# Patient Record
Sex: Male | Born: 1955 | Race: White | Hispanic: No | Marital: Married | Smoking: Never smoker
Health system: Southern US, Community
[De-identification: ages and names within clinical notes are randomized; demographics above are authoritative.]

## PROBLEM LIST (undated history)

## (undated) HISTORY — PX: HERNIA REPAIR: SHX51

---

## 2008-02-17 ENCOUNTER — Emergency Department (HOSPITAL_BASED_OUTPATIENT_CLINIC_OR_DEPARTMENT_OTHER): Admission: EM | Admit: 2008-02-17 | Discharge: 2008-02-17 | Payer: Self-pay | Admitting: Emergency Medicine

## 2008-08-11 ENCOUNTER — Emergency Department (HOSPITAL_BASED_OUTPATIENT_CLINIC_OR_DEPARTMENT_OTHER): Admission: EM | Admit: 2008-08-11 | Discharge: 2008-08-11 | Payer: Self-pay | Admitting: Emergency Medicine

## 2008-08-11 ENCOUNTER — Ambulatory Visit: Payer: Self-pay | Admitting: Diagnostic Radiology

## 2009-07-29 ENCOUNTER — Emergency Department (HOSPITAL_BASED_OUTPATIENT_CLINIC_OR_DEPARTMENT_OTHER): Admission: EM | Admit: 2009-07-29 | Discharge: 2009-07-29 | Payer: Self-pay | Admitting: Emergency Medicine

## 2009-07-29 ENCOUNTER — Ambulatory Visit: Payer: Self-pay | Admitting: Diagnostic Radiology

## 2019-06-13 ENCOUNTER — Emergency Department (HOSPITAL_BASED_OUTPATIENT_CLINIC_OR_DEPARTMENT_OTHER): Payer: Managed Care, Other (non HMO)

## 2019-06-13 ENCOUNTER — Other Ambulatory Visit: Payer: Self-pay

## 2019-06-13 ENCOUNTER — Emergency Department (HOSPITAL_BASED_OUTPATIENT_CLINIC_OR_DEPARTMENT_OTHER)
Admission: EM | Admit: 2019-06-13 | Discharge: 2019-06-13 | Disposition: A | Discharge status: Home / Self Care | Payer: Managed Care, Other (non HMO) | Attending: Emergency Medicine | Admitting: Emergency Medicine

## 2019-06-13 ENCOUNTER — Encounter (HOSPITAL_BASED_OUTPATIENT_CLINIC_OR_DEPARTMENT_OTHER): Payer: Self-pay | Admitting: Emergency Medicine

## 2019-06-13 DIAGNOSIS — N134 Hydroureter: Secondary | ICD-10-CM | POA: Diagnosis not present

## 2019-06-13 DIAGNOSIS — Z880 Allergy status to penicillin: Secondary | ICD-10-CM | POA: Insufficient documentation

## 2019-06-13 DIAGNOSIS — R319 Hematuria, unspecified: Secondary | ICD-10-CM | POA: Diagnosis present

## 2019-06-13 DIAGNOSIS — N132 Hydronephrosis with renal and ureteral calculous obstruction: Secondary | ICD-10-CM | POA: Diagnosis not present

## 2019-06-13 DIAGNOSIS — N200 Calculus of kidney: Secondary | ICD-10-CM

## 2019-06-13 LAB — BASIC METABOLIC PANEL
Anion gap: 8 (ref 5–15)
BUN: 20 mg/dL (ref 8–23)
CO2: 25 mmol/L (ref 22–32)
Calcium: 9.2 mg/dL (ref 8.9–10.3)
Chloride: 106 mmol/L (ref 98–111)
Creatinine, Ser: 1.09 mg/dL (ref 0.61–1.24)
GFR calc Af Amer: >60 mL/min (ref >60)
GFR calc non Af Amer: >60 mL/min (ref >60)
Glucose, Bld: 109 mg/dL — ABNORMAL HIGH (ref 70–99)
Potassium: 4.3 mmol/L (ref 3.5–5.1)
Sodium: 139 mmol/L (ref 135–145)

## 2019-06-13 LAB — URINALYSIS, MICROSCOPIC (REFLEX): RBC / HPF: >50 RBC/hpf (ref 0–5)

## 2019-06-13 LAB — CBC WITH DIFFERENTIAL/PLATELET
Abs Immature Granulocytes: 0.05 10*3/uL (ref 0.00–0.07)
Basophils Absolute: 0 10*3/uL (ref 0.0–0.1)
Basophils Relative: 0 %
Eosinophils Absolute: 0.1 10*3/uL (ref 0.0–0.5)
Eosinophils Relative: 1 %
HCT: 46.4 % (ref 39.0–52.0)
Hemoglobin: 14.9 g/dL (ref 13.0–17.0)
Immature Granulocytes: 0 %
Lymphocytes Relative: 20 %
Lymphs Abs: 2.3 10*3/uL (ref 0.7–4.0)
MCH: 29.6 pg (ref 26.0–34.0)
MCHC: 32.1 g/dL (ref 30.0–36.0)
MCV: 92.2 fL (ref 80.0–100.0)
Monocytes Absolute: 0.7 10*3/uL (ref 0.1–1.0)
Monocytes Relative: 6 %
Neutro Abs: 8 10*3/uL — ABNORMAL HIGH (ref 1.7–7.7)
Neutrophils Relative %: 73 %
Platelets: 179 10*3/uL (ref 150–400)
RBC: 5.03 MIL/uL (ref 4.22–5.81)
RDW: 13.5 % (ref 11.5–15.5)
WBC: 11.1 10*3/uL — ABNORMAL HIGH (ref 4.0–10.5)
nRBC: 0 % (ref 0.0–0.2)

## 2019-06-13 LAB — URINALYSIS, ROUTINE W REFLEX MICROSCOPIC
Bilirubin Urine: NEGATIVE
Glucose, UA: NEGATIVE mg/dL
Ketones, ur: NEGATIVE mg/dL
Leukocytes,Ua: NEGATIVE
Nitrite: NEGATIVE
Protein, ur: NEGATIVE mg/dL
Specific Gravity, Urine: 1.02 (ref 1.005–1.030)
pH: 6.5 (ref 5.0–8.0)

## 2019-06-13 MED ORDER — SULFAMETHOXAZOLE-TRIMETHOPRIM 800-160 MG PO TABS: 1.0000 | ORAL_TABLET | Freq: Two times a day (BID) | ORAL | 0 refills | Status: AC

## 2019-06-13 MED ORDER — TAMSULOSIN HCL 0.4 MG PO CAPS: 0.4000 mg | ORAL_CAPSULE | Freq: Every day | ORAL | 0 refills | Status: DC

## 2019-06-13 MED ORDER — KETOROLAC TROMETHAMINE 30 MG/ML IJ SOLN
30.0000 mg | Freq: Once | INTRAMUSCULAR | Status: AC
  Administered 2019-06-13: 11:00:00 30 mg via INTRAVENOUS
  Filled 2019-06-13: qty 1

## 2019-06-13 MED ORDER — HYDROCODONE-ACETAMINOPHEN 5-325 MG PO TABS: 1.0000 | ORAL_TABLET | ORAL | 0 refills | Status: DC | PRN

## 2019-06-13 MED ORDER — MORPHINE SULFATE (PF) 4 MG/ML IV SOLN
4.0000 mg | Freq: Once | INTRAVENOUS | Status: AC
  Administered 2019-06-13: 11:00:00 4 mg via INTRAVENOUS
  Filled 2019-06-13: qty 1

## 2019-06-13 NOTE — ED Notes (Signed)
Bladder scan 56ml

## 2019-06-13 NOTE — ED Provider Notes (Signed)
[facility] EMERGENCY DEPARTMENT Provider Note   CSN: [id number] Arrival date & time: 06/13/19  7564   History   Chief Complaint Chief Complaint  Patient presents with  . Hematuria   HPI Jim Galloway is a 63 y.o. male with past medical history significant for hernia repair who presents for evaluation of hematuria.  Patient states he has had dark-colored urine over the last 2 days.  Patient states he has difficulty beginning his stream and then feels like his stream is intermittent.  He feels like he is unable to fully empty his bladder.  He denies prior history of prostate issues.    Denies any testicular pain, swelling, penile discharge.  He does have history of renal and bladder stones.  He denies prior need of Foley catheter.  Denies fever, chills, nausea, vomiting, chest pain, shortness of breath, abdominal pain, flank pain, diarrhea, constipation.  He is able to tolerate p.o. intake at home without difficulty.  Denies additional aggravating or alleviating factors. Denies anticoagulation.  History obtained from patient, wife in room and past medical records.  No nterpreter is used.     HPI  History reviewed. No pertinent past medical history.  There are no active problems to display for this patient.   Past Surgical History:  Procedure Laterality Date  . HERNIA REPAIR          Home Medications    Prior to Admission medications   Medication Sig Start Date End Date Taking? Authorizing Provider  HYDROcodone-acetaminophen (NORCO/VICODIN) 5-325 MG tablet Take 1-2 tablets by mouth every 4 (four) hours as needed. 06/13/19   Stuart Guillen A, PA-C  sulfamethoxazole-trimethoprim (BACTRIM DS) 800-160 MG tablet Take 1 tablet by mouth 2 (two) times daily for 7 days. 06/13/19 06/20/19  Emile Ringgenberg A, PA-C  tamsulosin (FLOMAX) 0.4 MG CAPS capsule Take 1 capsule (0.4 mg total) by mouth daily. 06/13/19   Kadedra Vanaken A, PA-C    Family History No family history  on file.  Social History Social History   Tobacco Use  . Smoking status: Never Smoker  . Smokeless tobacco: Never Used  Substance Use Topics  . Alcohol use: Not Currently  . Drug use: Not on file     Allergies   Penicillins   Review of Systems Review of Systems  Constitutional: Negative.   HENT: Negative.   Respiratory: Negative.   Cardiovascular: Negative.   Gastrointestinal: Negative.   Genitourinary: Positive for difficulty urinating, dysuria and hematuria. Negative for decreased urine volume, discharge, flank pain, frequency, genital sores, penile pain, penile swelling, scrotal swelling, testicular pain and urgency.  Musculoskeletal: Negative.   Skin: Negative.   Neurological: Negative.   All other systems reviewed and are negative.    Physical Exam Updated Vital Signs BP (!) 151/76 (BP Location: Right Arm)   Pulse 89   Temp 98.5 F (36.9 C) (Oral)   Resp 20   Ht 5\' 6"  (1.676 m)   Wt 85.3 kg   SpO2 98%   BMI 30.34 kg/m   Physical Exam Vitals signs and nursing note reviewed. Exam conducted with a chaperone present.  Constitutional:      General: He is not in acute distress.    Appearance: He is well-developed. He is not ill-appearing, toxic-appearing or diaphoretic.  HENT:     Head: Normocephalic and atraumatic.     Nose: Nose normal.     Mouth/Throat:     Mouth: Mucous membranes are moist.     Pharynx: Oropharynx is clear.  Eyes:     Pupils: Pupils are equal, round, and reactive to light.  Neck:     Musculoskeletal: Normal range of motion and neck supple.  Cardiovascular:     Rate and Rhythm: Normal rate and regular rhythm.     Pulses: Normal pulses.  Pulmonary:     Effort: Pulmonary effort is normal. No respiratory distress.     Breath sounds: Normal breath sounds.  Abdominal:     General: Bowel sounds are normal. There is no distension.     Palpations: Abdomen is soft.     Tenderness: There is no abdominal tenderness. There is no right CVA  tenderness, left CVA tenderness, guarding or rebound.     Hernia: A hernia is present.     Comments: Soft, nontender without rebound or guarding.  Negative CVA tap  Genitourinary:    Penis: Normal.      Scrotum/Testes: Normal. Cremasteric reflex is present.     Epididymis:     Right: Normal.     Left: Normal.  Musculoskeletal: Normal range of motion.     Comments: Moves all 4 extremities without difficulty.  Skin:    General: Skin is warm and dry.     Comments: Brisk capillary refill.  No rashes or lesions.  Neurological:     Mental Status: He is alert.     Comments: Cranial nerves II through XII grossly intact.  Ambulatory without difficulty.    ED Treatments / Results  Labs (all labs ordered are listed, but only abnormal results are displayed) Labs Reviewed  URINALYSIS, ROUTINE W REFLEX MICROSCOPIC - Abnormal; Notable for the following components:      Result Value   APPearance HAZY (*)    Hgb urine dipstick LARGE (*)    All other components within normal limits  CBC WITH DIFFERENTIAL/PLATELET - Abnormal; Notable for the following components:   WBC 11.1 (*)    Neutro Abs 8.0 (*)    All other components within normal limits  BASIC METABOLIC PANEL - Abnormal; Notable for the following components:   Glucose, Bld 109 (*)    All other components within normal limits  URINALYSIS, MICROSCOPIC (REFLEX) - Abnormal; Notable for the following components:   Bacteria, UA MANY (*)    All other components within normal limits  URINE CULTURE    EKG None  Radiology Ct Renal Stone Study  Result Date: 06/13/2019 CLINICAL DATA:  Hematuria and dysuria. EXAM: CT ABDOMEN AND PELVIS WITHOUT CONTRAST TECHNIQUE: Multidetector CT imaging of the abdomen and pelvis was performed following the standard protocol without IV contrast. COMPARISON:  None. FINDINGS: Lower chest: No acute abnormality. Calcified granuloma identified in the right lung base. Hepatobiliary: No focal liver abnormality is  seen. No gallstones, gallbladder wall thickening, or biliary dilatation. Pancreas: Unremarkable. No pancreatic ductal dilatation or surrounding inflammatory changes. Spleen: Normal in size without focal abnormality. Adrenals/Urinary Tract: Normal appearance of the adrenal glands. Bilateral kidney stones identified. Right-sided hydronephrosis, hydroureter, periureteral fat stranding noted. Within the distal right ureter there is a stone just before the UVJ which measures 2.3 x 4.3 mm, image 61/4. Urinary bladder unremarkable. Stomach/Bowel: Stomach is within normal limits. Appendix appears normal. No evidence of bowel wall thickening, distention, or inflammatory changes. Scattered colonic diverticula identified. No acute inflammation Vascular/Lymphatic: Aortic atherosclerosis. No aneurysm. No abdominopelvic adenopathy. Reproductive: Prostate is unremarkable. Other: No free fluid or fluid collections. Musculoskeletal: No acute or significant osseous findings. Lumbar spondylosis noted. IMPRESSION: 1. Right-sided hydronephrosis and hydroureter secondary to distal right  ureteral calculus measuring 2.3 x 4.3 mm. 2. Bilateral nephrolithiasis. Aortic Atherosclerosis (ICD10-I70.0). Electronically Signed   By: Signa Kellaylor  Stroud M.D.   On: 06/13/2019 09:58    Procedures Procedures (including critical care time)  Medications Ordered in ED Medications  morphine 4 MG/ML injection 4 mg (4 mg Intravenous Given 06/13/19 1116)  ketorolac (TORADOL) 30 MG/ML injection 30 mg (30 mg Intravenous Given 06/13/19 1114)     Initial Impression / Assessment and Plan / ED Course  I have reviewed the triage vital signs and the nursing notes.  Pertinent labs & imaging results that were available during my care of the patient were reviewed by me and considered in my medical decision making (see chart for details).  63 year old male appears otherwise well presents for evaluation of hematuria.  He is afebrile, nonseptic,  non-ill-appearing.  Feels like he cannot empty his bladder.  Has difficulty starting his stream.  No prior history of prostate issues.  Abdomen soft.  Nonsurgical abdomen.  GU exam without any significant findings. Hx of renal stones and bladder stones.  No anticoagulation.  Plan for urinalysis, culture, CT stone, bladder scan.  CT scan with 4 mm right stone with mild hydronephrosis and perinephric stranding.  Urinalysis with some bacteria however negative leuks, nitrites, 0-5 WBC.  Will culture however low suspicion for infection.  On reevaluation patient does admit to some mild right-sided pain as well as some frequency in his urination.  Will provide pain management.  Labs without any acute findings, no abnormal creatinine or BUN. Tolerating PO intake without difficulty.  Pain controlled in ED.  Plan to DC home with pain management and close urology follow-up.  Discussed strict return precautions.  Patient voiced understanding and is agreeable for follow-up.  Patient is nontoxic, nonseptic appearing, in no apparent distress.  Patient's pain and other symptoms adequately managed in emergency department.  Fluid bolus given.  Labs, imaging and vitals reviewed.  Patient does not meet the SIRS or Sepsis criteria.  On repeat exam patient does not have a surgical abdomin and there are no peritoneal signs.  No indication of appendicitis, bowel obstruction, bowel perforation, cholecystitis, diverticulitis.  Patient discharged home with symptomatic treatment and given strict instructions for follow-up with their primary care physician.  I have also discussed reasons to return immediately to the ER.  Patient expresses understanding and agrees with plan.       Final Clinical Impressions(s) / ED Diagnoses   Final diagnoses:  Nephrolithiasis    ED Discharge Orders         Ordered    HYDROcodone-acetaminophen (NORCO/VICODIN) 5-325 MG tablet  Every 4 hours PRN     06/13/19 1148    tamsulosin (FLOMAX) 0.4  MG CAPS capsule  Daily     06/13/19 1148    sulfamethoxazole-trimethoprim (BACTRIM DS) 800-160 MG tablet  2 times daily     06/13/19 1148           Marquesha Robideau A, PA-C 06/13/19 1151    Arby BarrettePfeiffer, Marcy, MD 06/14/19 [phone]

## 2019-06-13 NOTE — Discharge Instructions (Signed)
Take the medications as prescribed.  Not drive or operate heavy machinery while taking this medication.  This medication may make you lightheaded and dizzy when you go from sitting to standing.  I would recommend dangling with your feet on the edge of the bed before you begin to move.  Make sure you drink plenty of fluids.  Follow-up with urology in the next 24 to 48 hours.  Return if you develop new or worsening symptoms such as high fever, inability to urinate, persistent vomiting, pain uncontrolled with medications provided to you at discharge.

## 2019-06-13 NOTE — ED Triage Notes (Signed)
Hematuria, dysuria, and difficulty urinating since yesterday.

## 2019-06-14 LAB — URINE CULTURE: Culture: NO GROWTH

## 2020-08-11 ENCOUNTER — Emergency Department (HOSPITAL_BASED_OUTPATIENT_CLINIC_OR_DEPARTMENT_OTHER): Payer: Managed Care, Other (non HMO)

## 2020-08-11 ENCOUNTER — Encounter (HOSPITAL_BASED_OUTPATIENT_CLINIC_OR_DEPARTMENT_OTHER): Payer: Self-pay | Admitting: Emergency Medicine

## 2020-08-11 ENCOUNTER — Emergency Department (HOSPITAL_BASED_OUTPATIENT_CLINIC_OR_DEPARTMENT_OTHER)
Admission: EM | Admit: 2020-08-11 | Discharge: 2020-08-11 | Disposition: A | Discharge status: Home / Self Care | Payer: Managed Care, Other (non HMO) | Attending: Emergency Medicine | Admitting: Emergency Medicine

## 2020-08-11 ENCOUNTER — Other Ambulatory Visit: Payer: Self-pay

## 2020-08-11 DIAGNOSIS — M25512 Pain in left shoulder: Secondary | ICD-10-CM | POA: Diagnosis not present

## 2020-08-11 DIAGNOSIS — Z20822 Contact with and (suspected) exposure to covid-19: Secondary | ICD-10-CM | POA: Diagnosis not present

## 2020-08-11 DIAGNOSIS — M549 Dorsalgia, unspecified: Secondary | ICD-10-CM | POA: Insufficient documentation

## 2020-08-11 DIAGNOSIS — R0789 Other chest pain: Secondary | ICD-10-CM | POA: Diagnosis present

## 2020-08-11 LAB — D-DIMER, QUANTITATIVE: D-Dimer, Quant: 1.39 ug/mL-FEU — ABNORMAL HIGH (ref 0.00–0.50)

## 2020-08-11 LAB — CBC
HCT: 47.8 % (ref 39.0–52.0)
Hemoglobin: 16.1 g/dL (ref 13.0–17.0)
MCH: 30 pg (ref 26.0–34.0)
MCHC: 33.7 g/dL (ref 30.0–36.0)
MCV: 89 fL (ref 80.0–100.0)
Platelets: 215 10*3/uL (ref 150–400)
RBC: 5.37 MIL/uL (ref 4.22–5.81)
RDW: 13.3 % (ref 11.5–15.5)
WBC: 12.4 10*3/uL — ABNORMAL HIGH (ref 4.0–10.5)
nRBC: 0 % (ref 0.0–0.2)

## 2020-08-11 LAB — COMPREHENSIVE METABOLIC PANEL
ALT: 21 U/L (ref 0–44)
AST: 19 U/L (ref 15–41)
Albumin: 4.3 g/dL (ref 3.5–5.0)
Alkaline Phosphatase: 85 U/L (ref 38–126)
Anion gap: 9 (ref 5–15)
BUN: 22 mg/dL (ref 8–23)
CO2: 27 mmol/L (ref 22–32)
Calcium: 8.9 mg/dL (ref 8.9–10.3)
Chloride: 100 mmol/L (ref 98–111)
Creatinine, Ser: 1.11 mg/dL (ref 0.61–1.24)
GFR, Estimated: >60 mL/min (ref >60)
Glucose, Bld: 101 mg/dL — ABNORMAL HIGH (ref 70–99)
Potassium: 4.5 mmol/L (ref 3.5–5.1)
Sodium: 136 mmol/L (ref 135–145)
Total Bilirubin: 0.6 mg/dL (ref 0.3–1.2)
Total Protein: 7.7 g/dL (ref 6.5–8.1)

## 2020-08-11 LAB — RESP PANEL BY RT-PCR (FLU A&B, COVID) ARPGX2
Influenza A by PCR: NEGATIVE
Influenza B by PCR: NEGATIVE
SARS Coronavirus 2 by RT PCR: NEGATIVE

## 2020-08-11 LAB — TROPONIN I (HIGH SENSITIVITY)
Troponin I (High Sensitivity): 6 ng/L (ref <18)
Troponin I (High Sensitivity): 6 ng/L (ref <18)

## 2020-08-11 LAB — LIPASE, BLOOD: Lipase: 36 U/L (ref 11–51)

## 2020-08-11 MED ORDER — IOHEXOL 350 MG/ML SOLN
80.0000 mL | Freq: Once | INTRAVENOUS | Status: AC | PRN
  Administered 2020-08-11: 13:00:00 80 mL via INTRAVENOUS

## 2020-08-11 MED ORDER — MORPHINE SULFATE (PF) 4 MG/ML IV SOLN
4.0000 mg | Freq: Once | INTRAVENOUS | Status: AC
  Administered 2020-08-11: 12:00:00 4 mg via INTRAVENOUS
  Filled 2020-08-11: qty 1

## 2020-08-11 MED ORDER — ONDANSETRON HCL 4 MG/2ML IJ SOLN
4.0000 mg | Freq: Once | INTRAMUSCULAR | Status: AC
  Administered 2020-08-11: 12:00:00 4 mg via INTRAVENOUS
  Filled 2020-08-11: qty 2

## 2020-08-11 MED ORDER — ALUM & MAG HYDROXIDE-SIMETH 200-200-20 MG/5ML PO SUSP
30.0000 mL | Freq: Once | ORAL | Status: AC
  Administered 2020-08-11: 15:00:00 30 mL via ORAL
  Filled 2020-08-11: qty 30

## 2020-08-11 MED ORDER — PANTOPRAZOLE SODIUM 20 MG PO TBEC: 20.0000 mg | DELAYED_RELEASE_TABLET | Freq: Every day | ORAL | 0 refills | Status: AC

## 2020-08-11 MED ORDER — HYDROCODONE-ACETAMINOPHEN 5-325 MG PO TABS: 1.0000 | ORAL_TABLET | ORAL | 0 refills | Status: AC | PRN

## 2020-08-11 MED ORDER — DIAZEPAM 5 MG PO TABS: 5.0000 mg | ORAL_TABLET | Freq: Two times a day (BID) | ORAL | 0 refills | Status: AC

## 2020-08-11 NOTE — ED Triage Notes (Signed)
Pt having chest pain since Monday.  Pt seen at urgent care and ED.  Pt told he has pleurisy.  Pt does not believe them.  Pt states his chest hurts on the left radiating to left arm, shoulder and shoulder blade.  No fever, no N/V/D.  Pt denies SOB.  Pt states the shoulder bothers him the most.  Pt states it feels better when he raises his arms.

## 2020-08-11 NOTE — ED Provider Notes (Signed)
[facility] EMERGENCY DEPARTMENT Provider Note   CSN: [id number] Arrival date & time: 08/11/20  1012     History Chief Complaint  Patient presents with  . Chest Pain    Jim Galloway is a 64 y.o. male.  Pt presents to the ED today with cp.  He said it originally started on Monday, 12/20.  He was driving when it started.  He pulled over and then called EMS.  He went to the ED in [place], but was put int he [facility].  Pt had labs done, but was not seen by a provider because the wait was long and he left.  The pt went to UC on 12/21.  He was told he had pleurisy and put on a muscle relaxant.  That has not been helping him, so he came in today.  Pt said the pain is more localized in his left shoulder and back.  Pain is eased by putting his arms over his head.  No f/c.  No sob.     Past Medical History:  Diagnosis Date  . Gout  . High cholesterol  . Hyperlipidemia  . Hypoglycemia  . Kidney stone  . Pharyngitis    Past Surgical History:  Procedure Laterality Date  . HERNIA REPAIR         No family history on file.  Social History   Tobacco Use  . Smoking status: Never Smoker  . Smokeless tobacco: Never Used  Substance Use Topics  . Alcohol use: Not Currently    Home Medications Prior to Admission medications   Medication Sig Start Date End Date Taking? Authorizing Provider  diazepam (VALIUM) 5 MG tablet Take 1 tablet (5 mg total) by mouth 2 (two) times daily. 08/11/20   Jacalyn Lefevre, MD  HYDROcodone-acetaminophen (NORCO/VICODIN) 5-325 MG tablet Take 1 tablet by mouth every 4 (four) hours as needed. 08/11/20   Jacalyn Lefevre, MD  Multiple Vitamin (MULTIVITAMIN ADULT PO) Take by mouth.    [provider]  pantoprazole (PROTONIX) 20 MG tablet Take 1 tablet (20 mg total) by mouth daily. 08/11/20   Jacalyn Lefevre, MD    Allergies    Penicillins  Review of Systems   Review of Systems  Cardiovascular: Positive for chest pain.   Musculoskeletal:       Left shoulder pain  All other systems reviewed and are negative.   Physical Exam Updated Vital Signs BP 138/79 (BP Location: Left Arm)   Pulse 73   Temp 98.1 F (36.7 C)   Resp 20   Ht 5\' 6"  (1.676 m)   Wt 86.2 kg   SpO2 99%   BMI 30.67 kg/m   Physical Exam Vitals and nursing note reviewed.  Constitutional:      Appearance: He is well-developed.  HENT:     Head: Normocephalic and atraumatic.  Eyes:     Extraocular Movements: Extraocular movements intact.     Pupils: Pupils are equal, round, and reactive to light.  Cardiovascular:     Rate and Rhythm: Normal rate and regular rhythm.     Heart sounds: Normal heart sounds.  Pulmonary:     Effort: Pulmonary effort is normal.     Breath sounds: Normal breath sounds.  Abdominal:     General: Bowel sounds are normal.     Palpations: Abdomen is soft.  Musculoskeletal:        General: Normal range of motion.     Cervical back: Normal range of motion and neck supple.  Back:  Skin:    General: Skin is warm.     Capillary Refill: Capillary refill takes less than 2 seconds.  Neurological:     General: No focal deficit present.     Mental Status: He is alert and oriented to person, place, and time.     ED Results / Procedures / Treatments   Labs (all labs ordered are listed, but only abnormal results are displayed) Labs Reviewed  CBC - Abnormal; Notable for the following components:      Result Value   WBC 12.4 (*)    All other components within normal limits  COMPREHENSIVE METABOLIC PANEL - Abnormal; Notable for the following components:   Glucose, Bld 101 (*)    All other components within normal limits  D-DIMER, QUANTITATIVE (NOT AT [facility] [place] [facility]) - Abnormal; Notable for the following components:   D-Dimer, Quant 1.39 (*)    All other components within normal limits  RESP PANEL BY RT-PCR (FLU A&B, COVID) [id number]  LIPASE, BLOOD  TROPONIN I (HIGH SENSITIVITY)  TROPONIN I (HIGH SENSITIVITY)     EKG EKG Interpretation  Date/Time:  Friday August 11 2020 10:15:30 EST Ventricular Rate:  73 PR Interval:  142 QRS Duration: 84 QT Interval:  382 QTC Calculation: 420 R Axis:   72 Text Interpretation: Normal sinus rhythm Normal ECG No old tracing to compare Confirmed by Jacalyn Lefevre [phone]) on 08/11/2020 10:37:11 AM   Radiology DG Chest 2 View  Result Date: 08/11/2020 CLINICAL DATA:  Chest and left arm pain for several days, initial encounter EXAM: CHEST - 2 VIEW COMPARISON:  07/29/2009 FINDINGS: Cardiac shadow is within normal limits. Aortic calcifications are noted. The lungs are well aerated bilaterally. No focal infiltrate or effusion is seen. Somewhat nodular density is noted along the anterior aspect of the lung base which is likely related to prominent epicardial fat pad. No other focal abnormality is noted. IMPRESSION: No active cardiopulmonary disease. Electronically Signed   By: Alcide Clever M.D.   On: 08/11/2020 11:20   CT Angio Chest PE W and/or Wo Contrast  Result Date: 08/11/2020 CLINICAL DATA:  Left arm pain for 1 week, elevated D-dimer, suspected PE EXAM: CT ANGIOGRAPHY CHEST WITH CONTRAST TECHNIQUE: Multidetector CT imaging of the chest was performed using the standard protocol during bolus administration of intravenous contrast. Multiplanar CT image reconstructions and MIPs were obtained to evaluate the vascular anatomy. CONTRAST:  40mL OMNIPAQUE IOHEXOL 350 MG/ML SOLN COMPARISON:  None. FINDINGS: Cardiovascular: Satisfactory opacification of the pulmonary arteries to the segmental level. No evidence of pulmonary embolism. Normal heart size. Three-vessel coronary artery calcifications. No pericardial effusion. Aortic atherosclerosis. Mediastinum/Nodes: No enlarged mediastinal, hilar, or axillary lymph nodes. Thyroid gland, trachea, and esophagus demonstrate no significant findings. Lungs/Pleura: Probable mild centrilobular emphysema. Benign, calcified nodule of the  right lower lobe. No pleural effusion or pneumothorax. Upper Abdomen: No acute abnormality. Musculoskeletal: No chest wall abnormality. No acute or significant osseous findings. Review of the MIP images confirms the above findings. IMPRESSION: 1. Negative examination for pulmonary embolism. 2. Probable mild centrilobular emphysema. 3. Coronary artery disease. Aortic Atherosclerosis (ICD10-I70.0) and Emphysema (ICD10-J43.9). Electronically Signed   By: Lauralyn Primes M.D.   On: 08/11/2020 13:04    Procedures Procedures (including critical care time)  Medications Ordered in ED Medications  alum & mag hydroxide-simeth (MAALOX/MYLANTA) 200-200-20 MG/5ML suspension 30 mL (has no administration in time range)  morphine 4 MG/ML injection 4 mg (4 mg Intravenous Given 08/11/20 1133)  ondansetron (ZOFRAN) injection 4 mg (4  mg Intravenous Given 08/11/20 1131)  iohexol (OMNIPAQUE) 350 MG/ML injection 80 mL (80 mLs Intravenous Contrast Given 08/11/20 1235)    ED Course  I have reviewed the triage vital signs and the nursing notes.  Pertinent labs & imaging results that were available during my care of the patient were reviewed by me and considered in my medical decision making (see chart for details).    MDM Rules/Calculators/A&P                          Pt is feeling better.  No etiology of cp found.  His wife said he's been belching a lot, so I will start him on protonix.  His left shoulder is hurting now, but sx started in his chest.  Sx are musculoskeletal in nature.  He is stable for d/c.  Return if worse.  Final Clinical Impression(s) / ED Diagnoses Final diagnoses:  Atypical chest pain    Rx / DC Orders ED Discharge Orders         Ordered    pantoprazole (PROTONIX) 20 MG tablet  Daily        08/11/20 1411    HYDROcodone-acetaminophen (NORCO/VICODIN) 5-325 MG tablet  Every 4 hours PRN        08/11/20 1411    diazepam (VALIUM) 5 MG tablet  2 times daily        08/11/20 1411            Jacalyn Lefevre, MD 08/11/20 1413

## 2021-10-21 IMAGING — CT CT ANGIO CHEST
2 of 7 series · 18 of 36 positions shown · IV contrast (Omnipaque)
Comparison: None.

CLINICAL DATA: Left arm pain for 1 week, elevated D-dimer,
suspected PE

EXAM:
CT ANGIOGRAPHY CHEST WITH CONTRAST
TECHNIQUE: Multidetector CT imaging of the chest was performed using the
standard protocol during bolus administration of intravenous
contrast. Multiplanar CT image reconstructions and MIPs were
obtained to evaluate the vascular anatomy.
CONTRAST:  80mL OMNIPAQUE IOHEXOL 350 MG/ML SOLN

[Series 6: pe thins · axial · 0.76mm/px · z∈[-131,+144]mm · 15 of 309 slices shown]
[im 17/309  lung]
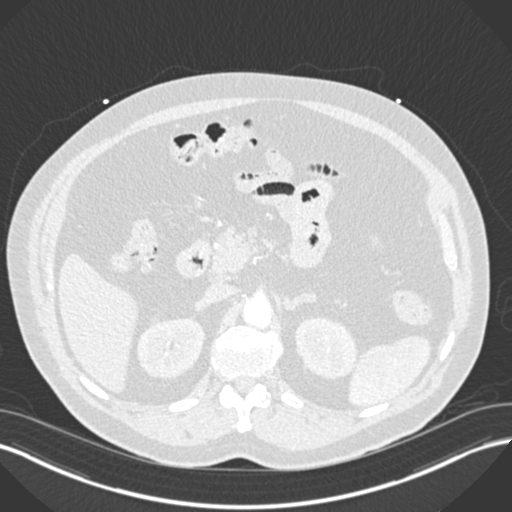
[im 33/309  mediastinal]
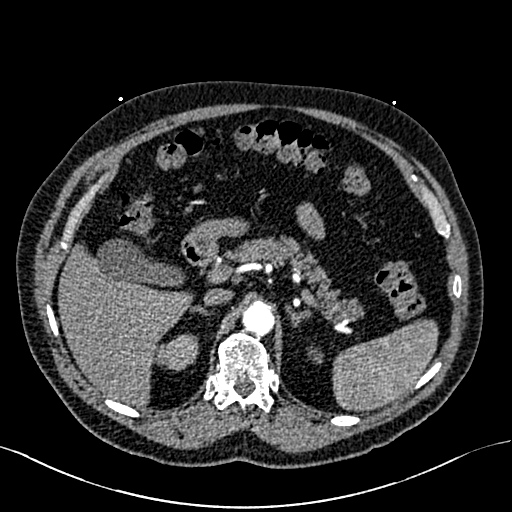
[im 65/309  lung]
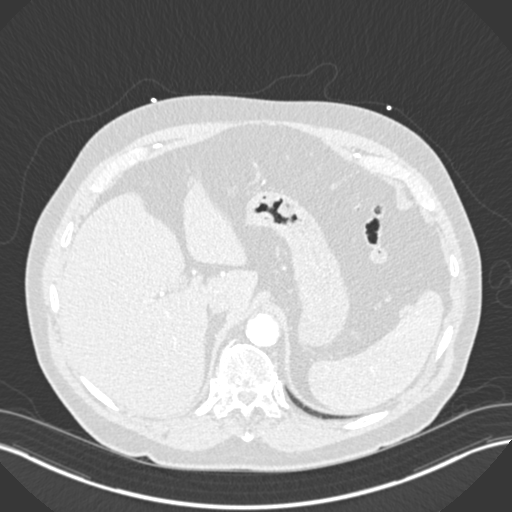
[im 82/309  mediastinal]
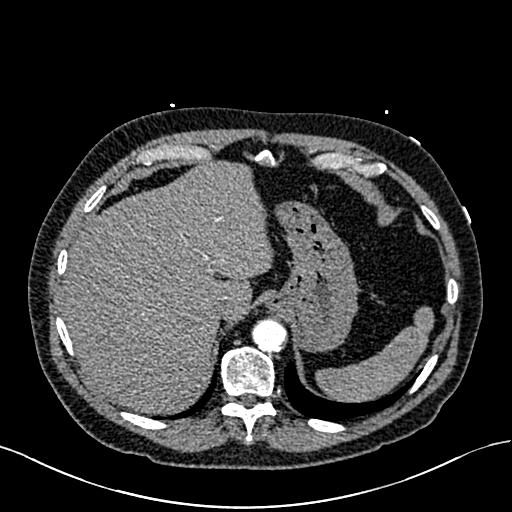
[im 98/309  lung]
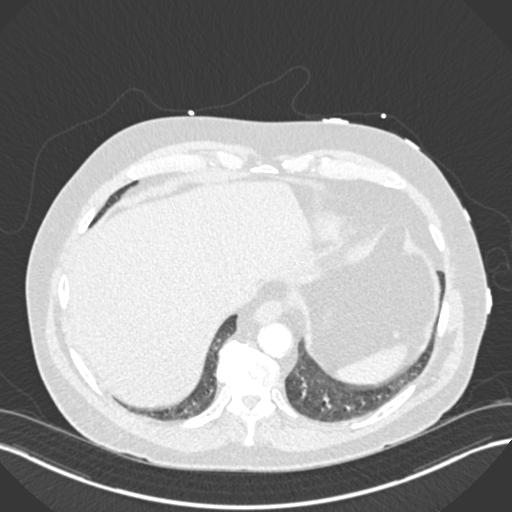
[im 114/309  mediastinal]
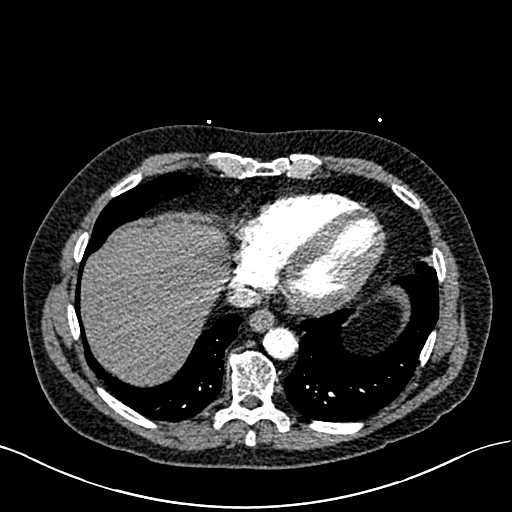
[im 130/309  lung]
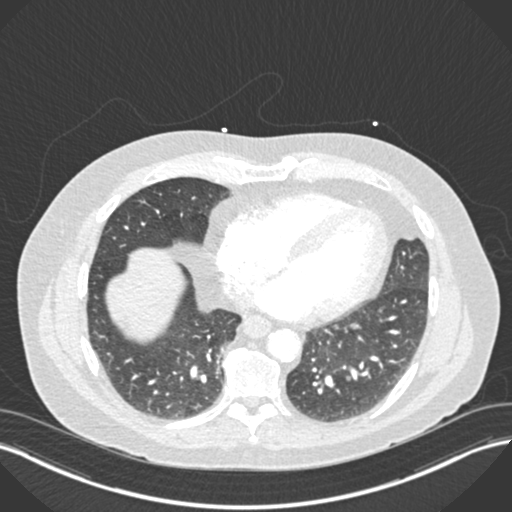
[im 163/309  mediastinal]
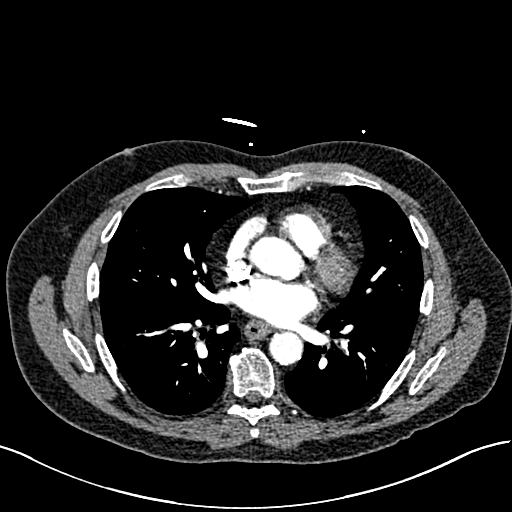
[im 179/309  lung]
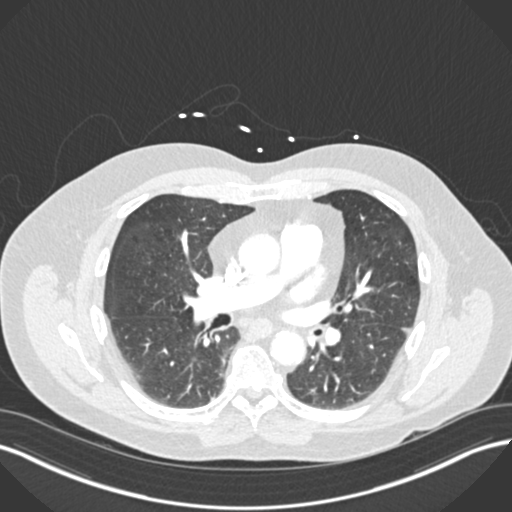
[im 195/309  mediastinal]
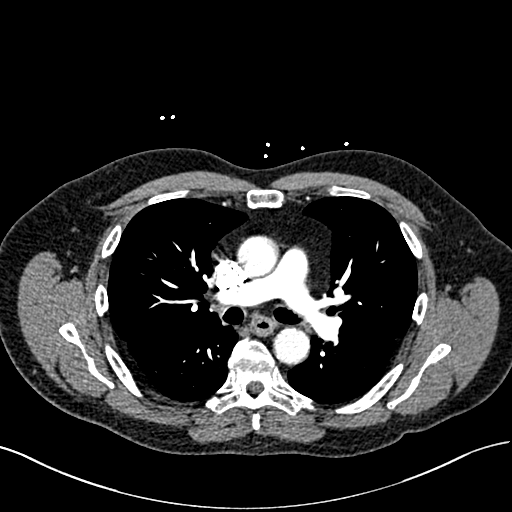
[im 211/309  lung]
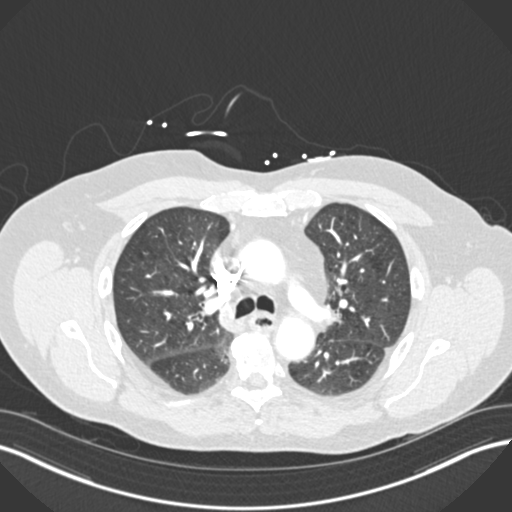
[im 227/309  mediastinal]
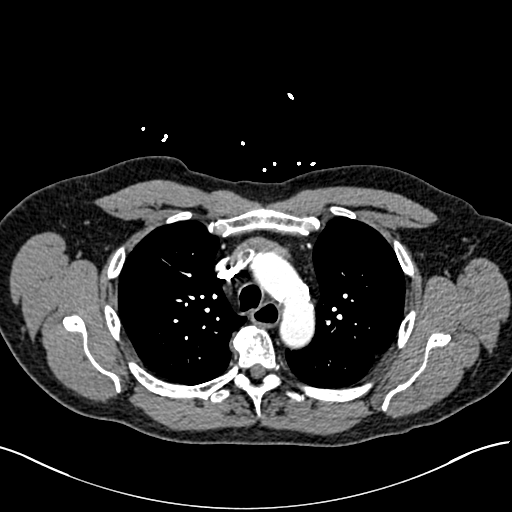
[im 260/309  lung]
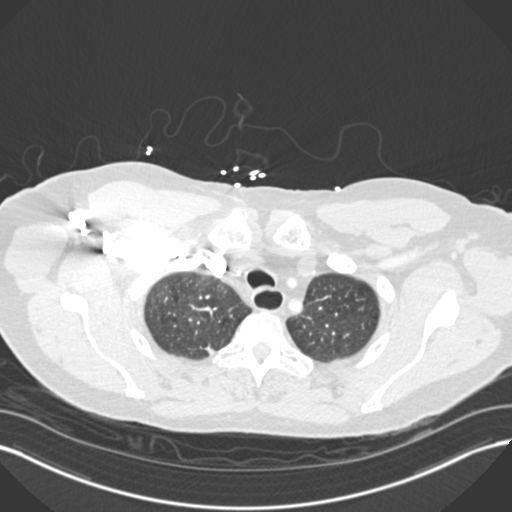
[im 276/309  mediastinal]
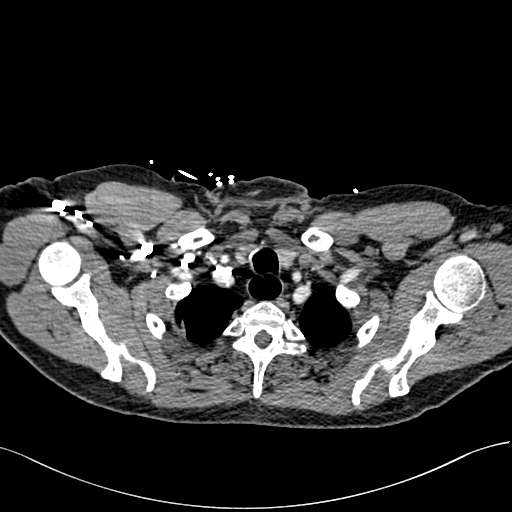
[im 292/309  lung]
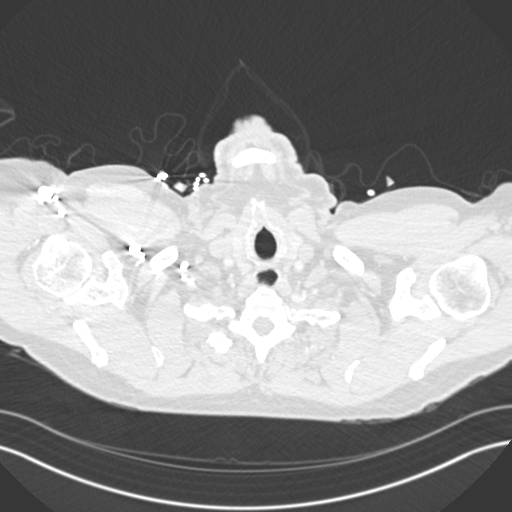

[Series 7: pe coronal mpr · coronal · 0.63mm/px · 3 of 152 slices shown]
[im 31/152  mediastinal]
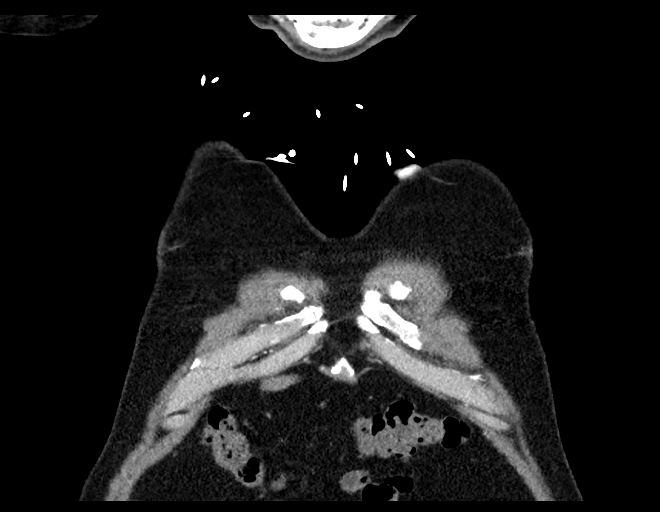
[im 61/152  mediastinal]
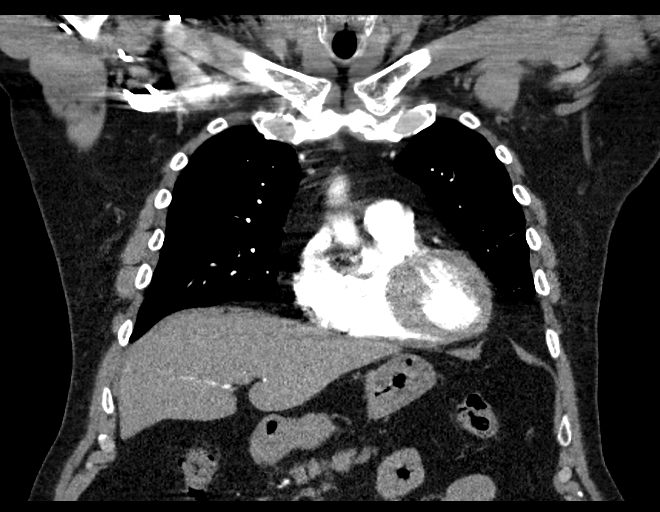
[im 91/152  mediastinal]
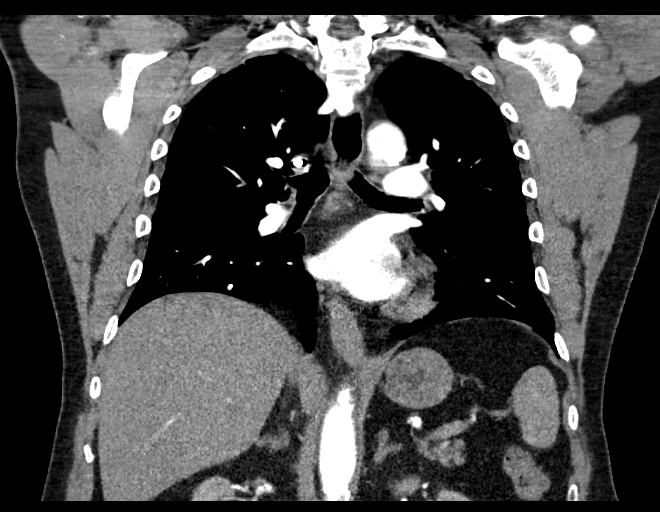

[18 of 36 positions shown; findings below may reference images not displayed]

FINDINGS: Cardiovascular: Satisfactory opacification of the pulmonary arteries
to the segmental level. No evidence of pulmonary embolism. Normal
heart size. Three-vessel coronary artery calcifications. No
pericardial effusion. Aortic atherosclerosis.

Mediastinum/Nodes: No enlarged mediastinal, hilar, or axillary lymph
nodes. Thyroid gland, trachea, and esophagus demonstrate no
significant findings.

Lungs/Pleura: Probable mild centrilobular emphysema. Benign,
calcified nodule of the right lower lobe. No pleural effusion or
pneumothorax.

Upper Abdomen: No acute abnormality.

Musculoskeletal: No chest wall abnormality. No acute or significant
osseous findings.

Review of the MIP images confirms the above findings.
IMPRESSION: 1. Negative examination for pulmonary embolism.
2. Probable mild centrilobular emphysema.
3. Coronary artery disease.

Aortic Atherosclerosis (MKUY3-N3V.V) and Emphysema (MKUY3-G56.I).
